# Patient Record
Sex: Female | Born: 1980 | Race: White | Hispanic: No | Marital: Single | Smoking: Never smoker
Health system: Southern US, Community
[De-identification: ages and names within clinical notes are randomized; demographics above are authoritative.]

## PROBLEM LIST (undated history)

## (undated) DIAGNOSIS — L309 Dermatitis, unspecified: Secondary | ICD-10-CM

## (undated) HISTORY — DX: Dermatitis, unspecified: L30.9

---

## 2010-11-26 ENCOUNTER — Emergency Department (HOSPITAL_COMMUNITY)
Admission: EM | Admit: 2010-11-26 | Discharge: 2010-11-26 | Payer: Self-pay | Source: Home / Self Care | Admitting: Emergency Medicine

## 2011-02-05 LAB — APTT: aPTT: 27 seconds (ref 24–37)

## 2011-02-05 LAB — CBC
HCT: 46.1 % — ABNORMAL HIGH (ref 36.0–46.0)
RBC: 5.32 MIL/uL — ABNORMAL HIGH (ref 3.87–5.11)
RDW: 12.4 % (ref 11.5–15.5)
WBC: 10.4 10*3/uL (ref 4.0–10.5)

## 2011-02-05 LAB — BASIC METABOLIC PANEL
BUN: 6 mg/dL (ref 6–23)
GFR calc Af Amer: >60 mL/min (ref >60)
GFR calc non Af Amer: >60 mL/min (ref >60)
Potassium: 3.4 mEq/L — ABNORMAL LOW (ref 3.5–5.1)
Sodium: 137 mEq/L (ref 135–145)

## 2011-02-05 LAB — PROTIME-INR: INR: 0.92 (ref 0.00–1.49)

## 2011-02-05 LAB — DIFFERENTIAL
Basophils Absolute: 0 10*3/uL (ref 0.0–0.1)
Lymphocytes Relative: 12 % (ref 12–46)
Lymphs Abs: 1.3 10*3/uL (ref 0.7–4.0)
Monocytes Absolute: 0.5 10*3/uL (ref 0.1–1.0)
Neutro Abs: 8.6 10*3/uL — ABNORMAL HIGH (ref 1.7–7.7)

## 2011-02-05 LAB — D-DIMER, QUANTITATIVE: D-Dimer, Quant: <0.22 ug/mL-FEU (ref 0.00–0.48)

## 2013-07-10 ENCOUNTER — Other Ambulatory Visit: Payer: Self-pay | Admitting: Chiropractic Medicine

## 2013-07-10 ENCOUNTER — Ambulatory Visit
Admission: RE | Admit: 2013-07-10 | Discharge: 2013-07-10 | Disposition: A | Discharge status: Home / Self Care | Payer: BC Managed Care – PPO | Source: Ambulatory Visit | Attending: Chiropractic Medicine | Admitting: Chiropractic Medicine

## 2013-07-10 DIAGNOSIS — M545 Low back pain, unspecified: Secondary | ICD-10-CM

## 2014-11-13 IMAGING — CR DG LUMBAR SPINE COMPLETE W/ BEND
7 series · 7 of 7 positions shown · non-contrast
Comparison: 11/26/2010 chest x-ray.

CLINICAL DATA: Low back pain for two through weeks.  No trauma.

LUMBAR SPINE - COMPLETE WITH BENDING VIEWS

[t l-spine a.p.]
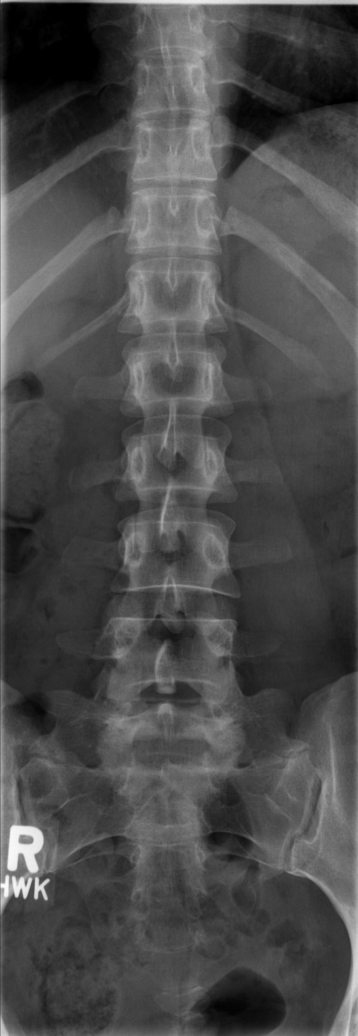

[t l-spine oblique exposure (1 of 2)]
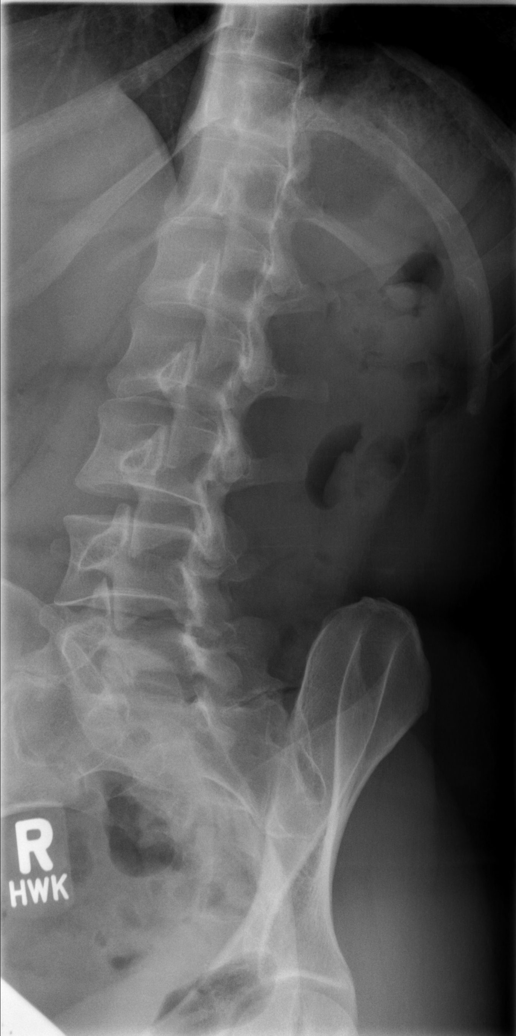

[t l-spine oblique exposure (2 of 2)]
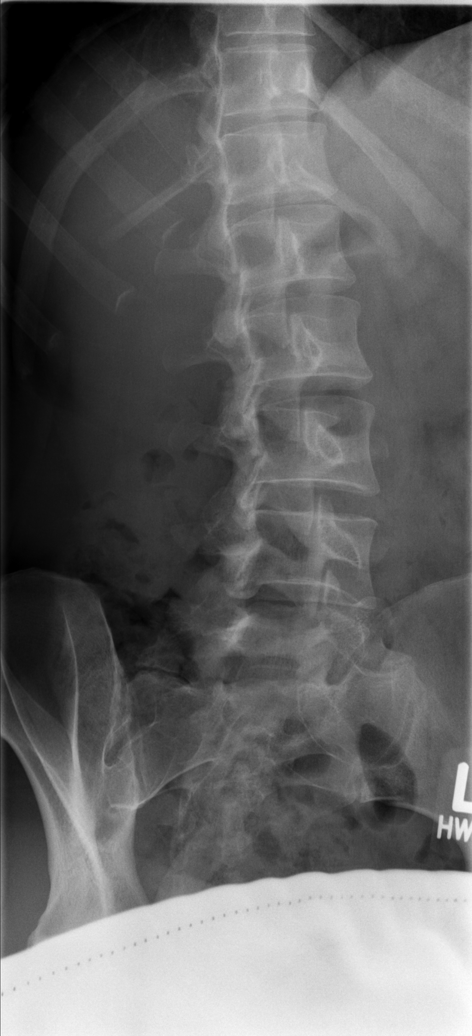

[t l-spine lat]
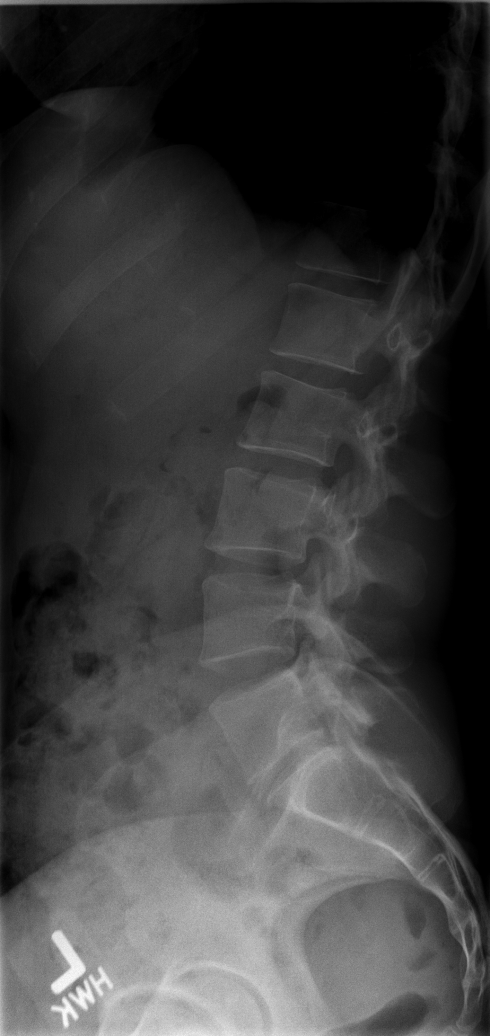

[t l-spine l5-s1 spot *]
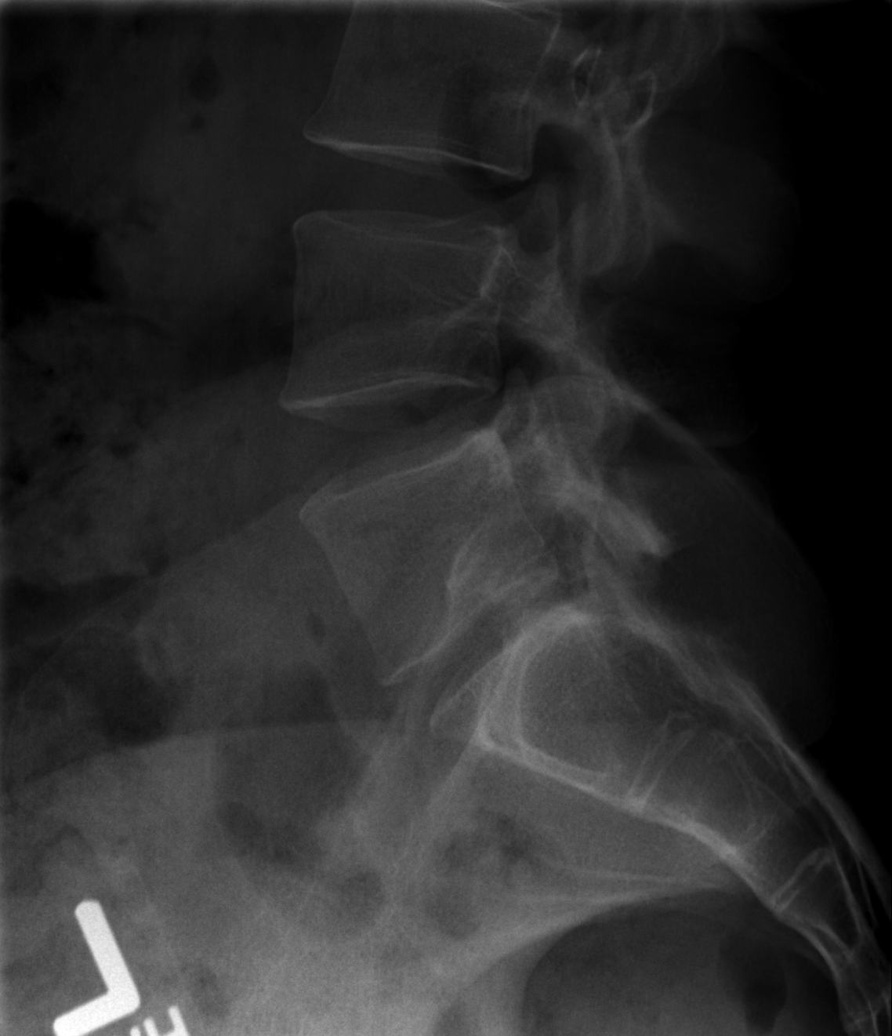

[w l-spine flexion/extension * (1 of 2)]
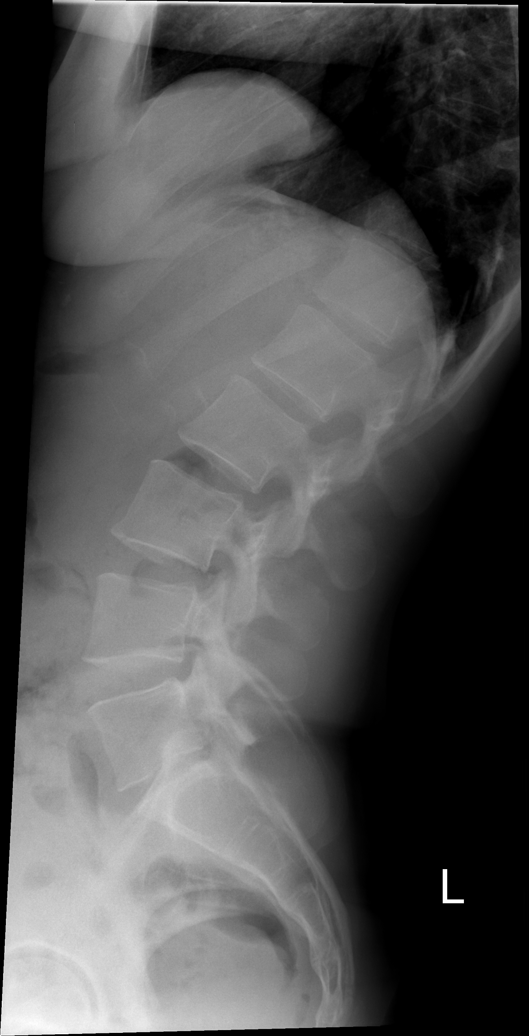

[w l-spine flexion/extension * (2 of 2)]
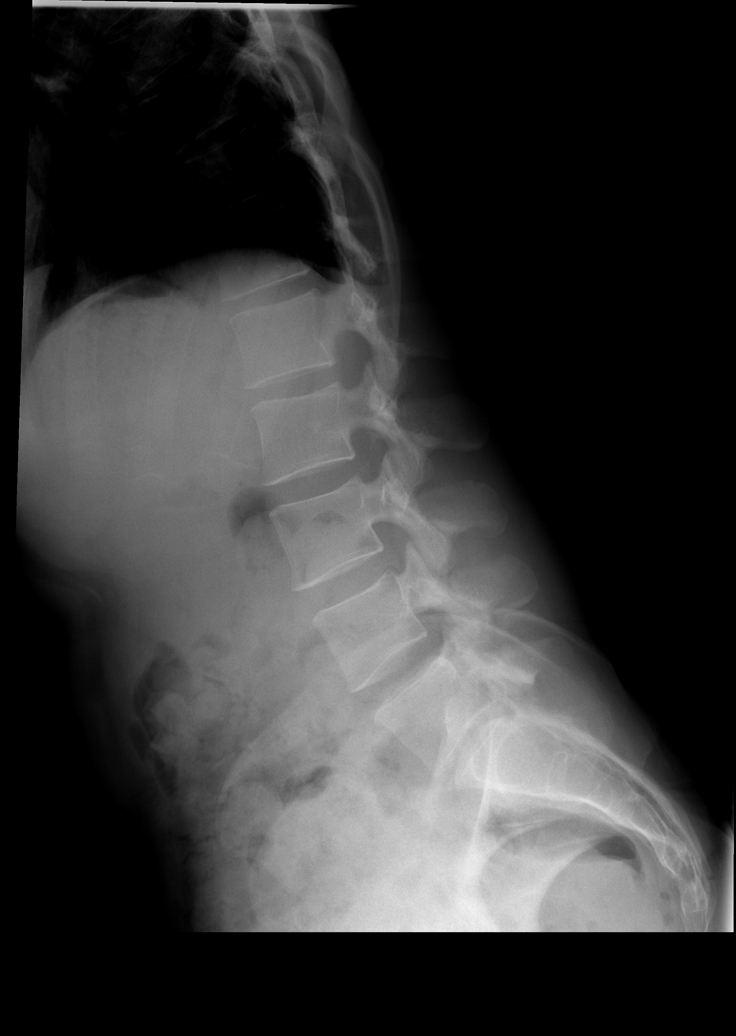

[7 of 7 positions shown; findings below may reference images not displayed]

FINDINGS: Utilizing prior chest x-ray, T12 ribs are noted to be
small.  There are four non-rib-bearing lumbar-type vertebra and a
transitional L5 vertebral body with a rudimentary disc at the L5-S1
level.

No lumbar disc space narrowing otherwise noted.

No abnormal motion between flexion and extension.

No pars defect detected.
IMPRESSION: Utilizing prior chest x-ray, T12 ribs are noted to be small.  There
are four non-rib-bearing lumbar-type vertebra and a transitional L5
vertebral body with a rudimentary disc at the L5-S1 level.

No lumbar disc space narrowing otherwise noted.

No abnormal motion between flexion and extension.

## 2019-07-16 ENCOUNTER — Ambulatory Visit (INDEPENDENT_AMBULATORY_CARE_PROVIDER_SITE_OTHER): Payer: BC Managed Care – PPO | Admitting: Allergy & Immunology

## 2019-07-16 ENCOUNTER — Other Ambulatory Visit: Payer: Self-pay

## 2019-07-16 ENCOUNTER — Encounter: Payer: Self-pay | Admitting: Allergy & Immunology

## 2019-07-16 VITALS — BP 128/90 | HR 100 | Temp 98.1°F | Resp 20 | Ht 61.5 in | Wt 115.6 lb

## 2019-07-16 DIAGNOSIS — J302 Other seasonal allergic rhinitis: Secondary | ICD-10-CM | POA: Diagnosis not present

## 2019-07-16 DIAGNOSIS — J3089 Other allergic rhinitis: Secondary | ICD-10-CM | POA: Diagnosis not present

## 2019-07-16 MED ORDER — MONTELUKAST SODIUM 10 MG PO TABS: 10.0000 mg | ORAL_TABLET | Freq: Every day | ORAL | 5 refills | Status: AC

## 2019-07-16 MED ORDER — EPINEPHRINE 0.3 MG/0.3ML IJ SOAJ: 0.3000 mg | Freq: Once | INTRAMUSCULAR | 1 refills | Status: AC

## 2019-07-16 MED ORDER — LEVOCETIRIZINE DIHYDROCHLORIDE 5 MG PO TABS: 5.0000 mg | ORAL_TABLET | Freq: Every evening | ORAL | 3 refills | Status: AC

## 2019-07-16 NOTE — Progress Notes (Signed)
NEW PATIENT  Date of Service/Encounter:  07/16/19  Referring provider: Patient, No Pcp Per   Assessment:   Seasonal and perennial allergic rhinitis (mouse, tobacco, grasses, ragweed, weeds, trees, indoor molds, outdoor molds, dust mites, cat, dog and cockroach)  Plan/Recommendations:   1. Seasonal and perennial allergic rhinitis - Testing today showed: mouse, tobacco, grasses, ragweed, weeds, trees, indoor molds, outdoor molds, dust mites, cat, dog and cockroach - Copy of test results provided.  - Avoidance measures provided. - Stop taking: all of your current atihistamines - Start taking: Xyzal (levocetirizine) 5mg  tablet once daily and Singulair (montelukast) 10mg  daily  - Singulair can cause increases in depressionn and nightmares, so stop it and give us a call if this occurs.  - You can use an extra dose of the antihistamine, if needed, for breakthrough symptoms.  - Consider nasal saline rinses 1-2 times daily to remove allergens from the nasal cavities as well as help with mucous clearance (this is especially helpful to do before the nasal sprays are given) - Consider allergy shots as a means of long-term control. - Allergy shots "re-train" and "reset" the immune system to ignore environmental allergens and decrease the resulting immune response to those allergens (sneezing, itchy watery eyes, runny nose, nasal congestion, etc).    - Allergy shots improve symptoms in 75-85% of patients.  - Call your insurance company to check on co-payments and give us a call when you make a decision.  2. Return in about 3 months (around 10/16/2019). This can be an in-person, a virtual Webex or a telephone follow up visit.  Subjective:   Nichole Stanton is a 38 y.o. female presenting today for evaluation of  Chief Complaint  Patient presents with  . Allergy Testing  . Immunotherapy    Nichole DingwallJennifer Stanton has a history of the following: Patient Active Problem List   Diagnosis Date Noted  .  Seasonal and perennial allergic rhinitis 07/16/2019    History obtained from: chart review and patient.  Nichole DingwallJennifer Stanton was referred by Patient, No Pcp Per.     Nichole Stanton is a 38 y.o. female presenting for skin testing.   Patient reports moving to [place] 13 years ago from [place]. Before moving she had been on allergy shots for 4 years and allergies were well controlled. Over the past few years she has noticed a progression in her allergic symptoms. She takes allegra during the spring, but otherwise not on any allergy medications. As a child she had Eczema , but it stopped when she was a teen. Recently had a few flairs of Ezema she notices in spots where she was carrying her puppy. Her puppy is a [employer] [place] Terrier. No history of Asthma, urticaria, food allergies, or anaphylaxis.   She is from [place]. [place]. Symptoms have been progressing over the course of the last 13 years since moving here. She works for [employer], especially with regards to life insurance.   Otherwise, there is no history of other atopic diseases, including asthma, food allergies, drug allergies, stinging insect allergies, urticaria or contact dermatitis. There is no significant infectious history.    Past Medical History: Patient Active Problem List   Diagnosis Date Noted  . Seasonal and perennial allergic rhinitis 07/16/2019    Medication List:  Allergies as of 07/16/2019   Not on File     Medication List       Accurate as of July 16, 2019 12:15 PM. If  you have any questions, ask your nurse or doctor.        fexofenadine 180 MG tablet Commonly known as: ALLEGRA Take 180 mg by mouth daily.        Past Surgical History: History reviewed. No pertinent surgical history.   Family History: Family History  Problem Relation Age of Onset  . Eczema Mother      Social History: Nichole Stanton lives at home with her dog.  She lives in a house that  is 67 years old.  There is wood throughout the home.  They have electric heating and central cooling.  There is one Westie in the home. There is no cigarette smoking exposing. There are no dust mite coverings on the bedding. She is working as a Arts administrator.    12 point review of systems otherwise negative unless mentioned in HPI    Objective:   Blood pressure 128/90, pulse 100, temperature 98.1 F (36.7 C), temperature source Temporal, resp. rate 20, height 5' 1.5" (1.562 m), weight 115 lb 9.6 oz (52.4 kg), SpO2 97 %. Body mass index is 21.49 kg/m.   Physical Exam:   Physical Exam  Constitutional: She appears well-developed and well-nourished.  Pleasant female.  HENT:  Head: Normocephalic and atraumatic.  Right Ear: Tympanic membrane, external ear and ear canal normal. No drainage, swelling or tenderness. Tympanic membrane is not injected, not scarred, not erythematous, not retracted and not bulging.  Left Ear: Tympanic membrane, external ear and ear canal normal. No drainage, swelling or tenderness. Tympanic membrane is not injected, not scarred, not erythematous, not retracted and not bulging.  Nose: Mucosal edema and rhinorrhea present. No nasal deformity or septal deviation. No epistaxis. Right sinus exhibits no maxillary sinus tenderness and no frontal sinus tenderness. Left sinus exhibits no maxillary sinus tenderness and no frontal sinus tenderness.  Mouth/Throat: Uvula is midline and oropharynx is clear and moist. Mucous membranes are not pale and not dry.  Cobblestoning present in the posterior oropharynx.   Eyes: Pupils are equal, round, and reactive to light. Conjunctivae and EOM are normal. Right eye exhibits no chemosis and no discharge. Left eye exhibits no chemosis and no discharge. Right conjunctiva is not injected. Left conjunctiva is not injected. No scleral icterus.  Cardiovascular: Normal rate, regular rhythm and normal heart sounds.  Respiratory: Effort normal  and breath sounds normal. No accessory muscle usage. No tachypnea. No respiratory distress. She has no wheezes. She has no rhonchi. She has no rales. She exhibits no tenderness.  No wheezing or crackles noted. No increased work of breathing.   GI: There is no abdominal tenderness. There is no rebound and no guarding.  Lymphadenopathy:       Head (right side): No submandibular, no tonsillar and no occipital adenopathy present.       Head (left side): No submandibular, no tonsillar and no occipital adenopathy present.    She has no cervical adenopathy.  Neurological: She is alert.  Skin: No abrasion, no petechiae and no rash noted. Rash is not papular, not vesicular and not urticarial. No erythema. No pallor.  No urticarial or eczematous lesions noted.   Psychiatric: She has a normal mood and affect.     Allergy Studies:    Airborne Adult Perc - 07/16/19 1102    Time Antigen Placed  1103    Allergen Manufacturer  Lavella Hammock    Location  Back    Number of Test  59    Panel 1  Select  1. Control-Buffer 50% Glycerol  Negative    2. Control-Histamine 1 mg/ml  2+    3. Albumin saline  Negative    4. Bahia  Negative    5. [place] Territ[place]  Negative    6. Johnson  Negative    7. Kentucky Blue  2+    8. Meadow Fescue  2+    9. Perennial Rye  Negative    10. Sweet Vernal  2+    11. Timothy  2+    12. Cocklebur  2+    13. Burweed Marshelder  Negative    14. Ragweed, short  3+    15. Ragweed, Giant  Negative    16. Plantain,  English  3+    17. Lamb's Quarters  2+    18. Sheep Sorrell  2+    19. Rough Pigweed  2+    20. Marsh Elder, Rough  2+    21. Mugwort, Common  2+    22. Ash mix  2+    23. Birch mix  3+    24. Beech American  3+    25. Box, Elder  Negative    26. Cedar, red  Negative    27. Cottonwood, Guinea-Biss[place]astern  Negative    28. Elm mix  2+    29. Hickory mix  3+    30. Maple mix  Negative    31. Oak, Guinea-Biss[place]astern mix  3+    32. Pecan Pollen  Negative    33. Pine mix  Negative    34.  Sycamore Eastern  Negative    35. Walnut, Black Pollen  3+    36. Alternaria alternata  2+    37. Cladosporium Herbarum  3+    38. Aspergillus mix  Negative    39. Penicillium mix  Negative    40. Bipolaris sorokiniana (Helminthosporium)  Negative    41. Drechslera spicifera (Curvularia)  2+    42. Mucor plumbeus  2+    43. Fusarium moniliforme  2+    44. Aureobasidium pullulans (pullulara)  2+    45. Rhizopus oryzae  2+    46. Botrytis cinera  Negative    47. Epicoccum nigrum  Negative    48. Phoma betae  Negative    49. Candida Albicans  3+    50. Trichophyton mentagrophytes  Negative    51. Mite, D Farinae  5,000 AU/ml  3+    52. Mite, D Pteronyssinus  5,000 AU/ml  3+    53. Cat Hair 10,000 BAU/ml  3+    54.  Dog Epithelia  Negative    55. Mixed Feathers  Negative    56. Horse Epithelia  2+    57. Cockroach, German  Negative    58. Mouse  2+    59. Tobacco Leaf  2+     Intradermal - 07/16/19 1102    Time Antigen Placed  1102    Allergen Manufacturer  Greer    Location  Arm    Number of Test  5    Intradermal  Select    Control  Negative    [place]  3+    Johnson  3+    Mold 1  Negative    Dog  2+       Allergy testing results were read and interpreted by myself, documented by clinical staff.     Thurmon FairJeff Steen, MD PGY1  [phone]    I performed a history and physical examination of the patient  and discussed her management with the resident. I reviewed the resident's note and agree with the documented findings and plan of care. The note in its entirety was edited by myself, including the physical exam, assessment, and plan.   Malachi BondsJoel Lott Seelbach, MD [facility]

## 2019-07-16 NOTE — Patient Instructions (Addendum)
1. Seasonal and perennial allergic rhinitis - Testing today showed: mouse, tobacco, grasses, ragweed, weeds, trees, indoor molds, outdoor molds, dust mites, cat, dog and cockroach - Copy of test results provided.  - Avoidance measures provided. - Stop taking: all of your current antihistamines - Start taking: Xyzal (levocetirizine) 5mg  tablet once daily and Singulair (montelukast) 10mg  daily  - Singulair can cause increases in depression and nightmares, so stop it and give us a call if this occurs.  - You can use an extra dose of the antihistamine, if needed, for breakthrough symptoms.  - Consider nasal saline rinses 1-2 times daily to remove allergens from the nasal cavities as well as help with mucous clearance (this is especially helpful to do before the nasal sprays are given) - Consider allergy shots as a means of long-term control. - Allergy shots "re-train" and "reset" the immune system to ignore environmental allergens and decrease the resulting immune response to those allergens (sneezing, itchy watery eyes, runny nose, nasal congestion, etc).    - Allergy shots improve symptoms in 75-85% of patients.  - Call your insurance company to check on co-payments and give us a call when you make a decision.  2. Return in about 3 months (around 10/16/2019). This can be an in-person, a virtual Webex or a telephone follow up visit.   Please inform us of any Emergency Department visits, hospitalizations, or changes in symptoms. Call us before going to the ED for breathing or allergy symptoms since we might be able to fit you in for a sick visit. Feel free to contact us anytime with any questions, problems, or concerns.  It was a pleasure to meet you today!  Websites that have reliable patient information: 1. American Academy of Asthma, Allergy, and Immunology: www.aaaai.org 2. Food Allergy Research and Education (FARE): foodallergy.org 3. Mothers of Asthmatics:  http://www.asthmacommunitynetwork.org 4. American College of Allergy, Asthma, and Immunology: www.acaai.org  Like us on [employer] and Instagram for our latest updates!      Make sure you are registered to vote! If you have moved or changed any of your contact information, you will need to get this updated before voting!  In some cases, you MAY be able to register to vote online: [email or web address]    Voter ID laws are NOT going into effect for the General Election in November 2020! DO NOT let this stop you from exercising your right to vote!   Absentee voting is the SAFEST way to vote during the coronavirus pandemic!   Download and print an absentee ballot request form at [email or web address] or you can scan the QR code below with your smart phone:      More information on absentee ballots can be found here: [email or web address]     Reducing Pollen Exposure  The American Academy of Allergy, Asthma and Immunology suggests the following steps to reduce your exposure to pollen during allergy seasons.    1. Do not hang sheets or clothing out to dry; pollen may collect on these items. 2. Do not mow lawns or spend time around freshly cut grass; mowing stirs up pollen. 3. Keep windows closed at night.  Keep car windows closed while driving. 4. Minimize morning activities outdoors, a time when pollen counts are usually at their highest. 5. Stay indoors as much as possible when pollen counts or humidity is high and on windy days when pollen tends to remain in the air longer. 6. Use air conditioning when possible.  Many air conditioners have filters that trap  the pollen spores. 7. Use a HEPA room air filter to remove pollen form the indoor air you breathe.  Control of Mold Allergen   Mold and fungi can grow on a variety of surfaces provided certain temperature and moisture conditions exist.  Outdoor molds grow on plants, decaying  vegetation and soil.  The major outdoor mold, Alternaria and Cladosporium, are found in very high numbers during hot and dry conditions.  Generally, a late Summer - Fall peak is seen for common outdoor fungal spores.  Rain will temporarily lower outdoor mold spore count, but counts rise rapidly when the rainy period ends.  The most important indoor molds are Aspergillus and Penicillium.  Dark, humid and poorly ventilated basements are ideal sites for mold growth.  The next most common sites of mold growth are the bathroom and the kitchen.  Outdoor (Seasonal) Mold Control  1. Use air conditioning and keep windows closed 2. Avoid exposure to decaying vegetation. 3. Avoid leaf raking. 4. Avoid grain handling. 5. Consider wearing a face mask if working in moldy areas.   Indoor (Perennial) Mold Control   1. Maintain humidity below 50%. 2. Clean washable surfaces with 5% bleach solution. 3. Remove sources e.g. contaminated carpets.     Control of Dog or Cat Allergen  Avoidance is the best way to manage a dog or cat allergy. If you have a dog or cat and are allergic to dog or cats, consider removing the dog or cat from the home. If you have a dog or cat but dont want to find it a new home, or if your family wants a pet even though someone in the household is allergic, here are some strategies that may help keep symptoms at bay:  1. Keep the pet out of your bedroom and restrict it to only a few rooms. Be advised that keeping the dog or cat in only one room will not limit the allergens to that room. 2. Dont pet, hug or kiss the dog or cat; if you do, wash your hands with soap and water. 3. High-efficiency particulate air (HEPA) cleaners run continuously in a bedroom or living room can reduce allergen levels over time. 4. Regular use of a high-efficiency vacuum cleaner or a central vacuum can reduce allergen levels. 5. Giving your dog or cat a bath at least once a week can reduce airborne  allergen.  Control of House Dust Mite Allergen    House dust mites play a major role in allergic asthma and rhinitis.  They occur in environments with high humidity wherever human skin, the food for dust mites is found. High levels have been detected in dust obtained from mattresses, pillows, carpets, upholstered furniture, bed covers, clothes and soft toys.  The principal allergen of the house dust mite is found in its feces.  A gram of dust may contain 1,000 mites and 250,000 fecal particles.  Mite antigen is easily measured in the air during house cleaning activities.    1. Encase mattresses, including the box spring, and pillow, in an air tight cover.  Seal the zipper end of the encased mattresses with wide adhesive tape. 2. Wash the bedding in water of 130 degrees Farenheit weekly.  Avoid cotton comforters/quilts and flannel bedding: the most ideal bed covering is the dacron comforter. 3. Remove all upholstered furniture from the bedroom. 4. Remove carpets, carpet padding, rugs, and non-washable window drapes from the bedroom.  Wash drapes weekly or use plastic window coverings. 5. Remove all non-washable  stuffed toys from the bedroom.  Wash stuffed toys weekly. 6. Have the room cleaned frequently with a vacuum cleaner and a damp dust-mop.  The patient should not be in a room which is being cleaned and should wait 1 hour after cleaning before going into the room. 7. Close and seal all heating outlets in the bedroom.  Otherwise, the room will become filled with dust-laden air.  An electric heater can be used to heat the room. 8. Reduce indoor humidity to less than 50%.  Do not use a humidifier.  Control of Cockroach Allergen  Cockroach allergen has been identified as an important cause of acute attacks of asthma, especially in urban settings.  There are fifty-five species of cockroach that exist in the [place], however only three, the [place], [place] and Oriental species produce  allergen that can affect patients with Asthma.  Allergens can be obtained from fecal particles, egg casings and secretions from cockroaches.    1. Remove food sources. 2. Reduce access to water. 3. Seal access and entry points. 4. Spray runways with 0.5-1% Diazinon or Chlorpyrifos 5. Blow boric acid power under stoves and refrigerator. 6. Place bait stations (hydramethylnon) at feeding sites.  Allergy Shots   Allergies are the result of a chain reaction that starts in the immune system. Your immune system controls how your body defends itself. For instance, if you have an allergy to pollen, your immune system identifies pollen as an invader or allergen. Your immune system overreacts by producing antibodies called Immunoglobulin E (IgE). These antibodies travel to cells that release chemicals, causing an allergic reaction.  The concept behind allergy immunotherapy, whether it is received in the form of shots or tablets, is that the immune system can be desensitized to specific allergens that trigger allergy symptoms. Although it requires time and patience, the payback can be long-term relief.  How Do Allergy Shots Work?  Allergy shots work much like a vaccine. Your body responds to injected amounts of a particular allergen given in increasing doses, eventually developing a resistance and tolerance to it. Allergy shots can lead to decreased, minimal or no allergy symptoms.  There generally are two phases: build-up and maintenance. Build-up often ranges from three to six months and involves receiving injections with increasing amounts of the allergens. The shots are typically given once or twice a week, though more rapid build-up schedules are sometimes used.  The maintenance phase begins when the most effective dose is reached. This dose is different for each person, depending on how allergic you are and your response to the build-up injections. Once the maintenance dose is reached, there are  longer periods between injections, typically two to four weeks.  Occasionally doctors give cortisone-type shots that can temporarily reduce allergy symptoms. These types of shots are different and should not be confused with allergy immunotherapy shots.  Who Can Be Treated with Allergy Shots?  Allergy shots may be a good treatment approach for people with allergic rhinitis (hay fever), allergic asthma, conjunctivitis (eye allergy) or stinging insect allergy.   Before deciding to begin allergy shots, you should consider:   The length of allergy season and the severity of your symptoms  Whether medications and/or changes to your environment can control your symptoms  Your desire to avoid long-term medication use  Time: allergy immunotherapy requires a major time commitment  Cost: may vary depending on your insurance coverage  Allergy shots for children age 46five and older are effective and often well tolerated. They  might prevent the onset of new allergen sensitivities or the progression to asthma.  Allergy shots are not started on patients who are pregnant but can be continued on patients who become pregnant while receiving them. In some patients with other medical conditions or who take certain common medications, allergy shots may be of risk. It is important to mention other medications you talk to your allergist.   When Will I Feel Better?  Some may experience decreased allergy symptoms during the build-up phase. For others, it may take as long as 12 months on the maintenance dose. If there is no improvement after a year of maintenance, your allergist will discuss other treatment options with you.  If you arent responding to allergy shots, it may be because there is not enough dose of the allergen in your vaccine or there are missing allergens that were not identified during your allergy testing. Other reasons could be that there are high levels of the allergen in your environment or  major exposure to non-allergic triggers like tobacco smoke.  What Is the Length of Treatment?  Once the maintenance dose is reached, allergy shots are generally continued for three to five years. The decision to stop should be discussed with your allergist at that time. Some people may experience a permanent reduction of allergy symptoms. Others may relapse and a longer course of allergy shots can be considered.  What Are the Possible Reactions?  The two types of adverse reactions that can occur with allergy shots are local and systemic. Common local reactions include very mild redness and swelling at the injection site, which can happen immediately or several hours after. A systemic reaction, which is less common, affects the entire body or a particular body system. They are usually mild and typically respond quickly to medications. Signs include increased allergy symptoms such as sneezing, a stuffy nose or hives.  Rarely, a serious systemic reaction called anaphylaxis can develop. Symptoms include swelling in the throat, wheezing, a feeling of tightness in the chest, nausea or dizziness. Most serious systemic reactions develop within 30 minutes of allergy shots. This is why it is strongly recommended you wait in your doctors office for 30 minutes after your injections. Your allergist is trained to watch for reactions, and his or her staff is trained and equipped with the proper medications to identify and treat them.  Who Should Administer Allergy Shots?  The preferred location for receiving shots is your prescribing allergists office. Injections can sometimes be given at another facility where the physician and staff are trained to recognize and treat reactions, and have received instructions by your prescribing allergist.

## 2019-10-27 ENCOUNTER — Ambulatory Visit: Payer: BC Managed Care – PPO | Admitting: Allergy & Immunology

## 2019-12-10 ENCOUNTER — Encounter: Payer: Self-pay | Admitting: Allergy & Immunology

## 2019-12-10 ENCOUNTER — Ambulatory Visit: Payer: 59 | Admitting: Allergy & Immunology

## 2019-12-10 ENCOUNTER — Other Ambulatory Visit: Payer: Self-pay

## 2019-12-10 VITALS — BP 104/66 | HR 86 | Temp 98.4°F | Resp 18 | Ht 61.5 in

## 2019-12-10 DIAGNOSIS — J302 Other seasonal allergic rhinitis: Secondary | ICD-10-CM | POA: Diagnosis not present

## 2019-12-10 DIAGNOSIS — J3089 Other allergic rhinitis: Secondary | ICD-10-CM | POA: Diagnosis not present

## 2019-12-10 MED ORDER — TRIAMCINOLONE ACETONIDE 0.1 % EX OINT: 1.0000 "application " | TOPICAL_OINTMENT | Freq: Two times a day (BID) | CUTANEOUS | 5 refills | Status: AC

## 2019-12-10 MED ORDER — EPINEPHRINE 0.3 MG/0.3ML IJ SOAJ: 0.3000 mg | Freq: Once | INTRAMUSCULAR | 1 refills | Status: AC

## 2019-12-10 NOTE — Patient Instructions (Addendum)
1. Seasonal and perennial allergic rhinitis (mouse, tobacco, grasses, ragweed, weeds, trees, indoor molds, outdoor molds, dust mites, cat, dog and cockroach) - Information for allergen immunotherapy provided. - Consent signed.  Audry Riles training provided.  - Continue taking: Xyzal (levocetirizine) 5mg  tablet 1-2 times daily - Make an appointment in two weeks for your first injection.   2. Return in about 6 months (around 06/08/2020). This can be an in-person, a virtual Webex or a telephone follow up visit.   Please inform 06/10/2020 of any Emergency Department visits, hospitalizations, or changes in symptoms. Call us before going to the ED for breathing or allergy symptoms since we might be able to fit you in for a sick visit. Feel free to contact us anytime with any questions, problems, or concerns.  It was a pleasure to see you again today! Zeus is so freaking adorable!!   Websites that have reliable patient information: 1. American Academy of Asthma, Allergy, and Immunology: www.aaaai.org 2. Food Allergy Research and Education (FARE): foodallergy.org 3. Mothers of Asthmatics: http://www.asthmacommunitynetwork.org 4. American College of Allergy, Asthma, and Immunology: www.acaai.org   COVID-19 Vaccine Information can be found at: [place] For questions related to vaccine distribution or appointments, please email [email or web address] or call [phone].     "Like" [phone] on Facebook and Instagram for our latest updates!        Make sure you are registered to vote! If you have moved or changed any of your contact information, you will need to get this updated before voting!  In some cases, you MAY be able to register to vote online: [place]

## 2019-12-10 NOTE — Progress Notes (Signed)
FOLLOW UP  Date of Service/Encounter:  12/10/19   Assessment:   Seasonal and perennial allergic rhinitis (mouse, tobacco, grasses, ragweed, weeds, trees, indoor molds, outdoor molds, dust mites, cat, dog and cockroach)  Plan/Recommendations:   1. Seasonal and perennial allergic rhinitis (mouse, tobacco, grasses, ragweed, weeds, trees, indoor molds, outdoor molds, dust mites, cat, dog and cockroach) - Information for allergen immunotherapy provided. - Consent signed.  Nichole Stanton training provided.  - Continue taking: Xyzal (levocetirizine) 5mg  tablet 1-2 times daily - Make an appointment in two weeks for your first injection.  - Prescription written with dust mite and dog dosing pushed.   2. Return in about 6 months (around 06/08/2020). This can be an in-person, a virtual Webex or a telephone follow up visit.  Subjective:   Nichole Stanton is a 39 y.o. female presenting today for follow up of  Chief Complaint  Patient presents with  . Allergic Rhinitis     Nichole Stanton has a history of the following: Patient Active Problem List   Diagnosis Date Noted  . Seasonal and perennial allergic rhinitis 07/16/2019    History obtained from: chart review and patient.  Nichole Stanton is a 39 y.o. female presenting for a follow up visit.  She was last seen in August 2020 as a new patient.  At that time, she was evaluated for worsening allergic rhinitis symptoms.  She had testing that was positive to mouse, tobacco, grasses, ragweed, weeds, trees, indoor and outdoor molds, dust mite, cat, dog, and cockroach.  We started her on Xyzal and Singulair.  We did not do any nasal sprays since she was not a fan of these.   Since last visit, she has done very well.  She does feel that the addition of the Xyzal has made her symptoms more tolerable.  She never did add the Singulair because she was worried about the psychiatric side effects.  She ended up taking a 6-week trip to [place] to work remotely at her  parents house.  She does want to start allergen immunotherapy, but she decided to come back now to discuss it since she was in [place] for so long.  She has not needed any prednisone or antibiotics since last visit.  She does have a lot of questions about allergen immunotherapy today.  She does feel that her symptoms of gotten worse since adopting her dog - Nichole Stanton - around 8 months ago.   Otherwise, there have been no changes to her past medical history, surgical history, family history, or social history.    Review of Systems  Constitutional: Negative.  Negative for chills, fever, malaise/fatigue and weight loss.  HENT: Positive for congestion. Negative for ear discharge, ear pain, nosebleeds and sinus pain.   Eyes: Negative for pain, discharge and redness.  Respiratory: Negative for cough, sputum production, shortness of breath and wheezing.   Cardiovascular: Negative.  Negative for chest pain and palpitations.  Gastrointestinal: Negative for abdominal pain, heartburn, nausea and vomiting.  Skin: Negative.  Negative for itching and rash.  Neurological: Negative for dizziness and headaches.  Endo/Heme/Allergies: Positive for environmental allergies. Does not bruise/bleed easily.       Objective:   Blood pressure 104/66, pulse 86, temperature 98.4 F (36.9 C), temperature source Temporal, resp. rate 18, height 5' 1.5" (1.562 m), SpO2 94 %. Body mass index is 21.49 kg/m.   Physical Exam:  Physical Exam  Constitutional: She appears well-developed.  Pleasant female. Very talkative.   HENT:  Head: Normocephalic and atraumatic.  Right Ear: Tympanic membrane, external ear and ear canal normal.  Left Ear: Tympanic membrane, external ear and ear canal normal.  Nose: Mucosal edema present. No rhinorrhea, nasal deformity or septal deviation. No epistaxis. Right sinus exhibits no maxillary sinus tenderness and no frontal sinus tenderness. Left sinus exhibits no maxillary sinus tenderness  and no frontal sinus tenderness.  Mouth/Throat: Uvula is midline and oropharynx is clear and moist. Mucous membranes are not pale and not dry.  Tonsils 2+ bilaterally without exudates. There are enlarged turbinates present.   Eyes: Pupils are equal, round, and reactive to light. Conjunctivae and EOM are normal. Right eye exhibits no chemosis and no discharge. Left eye exhibits no chemosis and no discharge. Right conjunctiva is not injected. Left conjunctiva is not injected.  Cardiovascular: Normal rate, regular rhythm and normal heart sounds.  Respiratory: Effort normal and breath sounds normal. No accessory muscle usage. No tachypnea. No respiratory distress. She has no wheezes. She has no rhonchi. She has no rales. She exhibits no tenderness.  Moving air well in all lung fields.   Lymphadenopathy:    She has no cervical adenopathy.  Neurological: She is alert.  Skin: No abrasion, no petechiae and no rash noted. Rash is not papular, not vesicular and not urticarial. No erythema. No pallor.  Psychiatric: She has a normal mood and affect.     Diagnostic studies: none    Malachi Bonds, MD  [facility]

## 2019-12-14 NOTE — Progress Notes (Signed)
EXP 12/15/20 

## 2019-12-16 DIAGNOSIS — J3089 Other allergic rhinitis: Secondary | ICD-10-CM | POA: Diagnosis not present

## 2019-12-17 DIAGNOSIS — J3081 Allergic rhinitis due to animal (cat) (dog) hair and dander: Secondary | ICD-10-CM

## 2019-12-31 ENCOUNTER — Other Ambulatory Visit: Payer: Self-pay

## 2019-12-31 ENCOUNTER — Ambulatory Visit (INDEPENDENT_AMBULATORY_CARE_PROVIDER_SITE_OTHER): Payer: 59

## 2019-12-31 DIAGNOSIS — J302 Other seasonal allergic rhinitis: Secondary | ICD-10-CM

## 2019-12-31 DIAGNOSIS — J3089 Other allergic rhinitis: Secondary | ICD-10-CM | POA: Diagnosis not present

## 2019-12-31 NOTE — Progress Notes (Signed)
Immunotherapy   Patient Details  Name: Nichole Stanton MRN: [id number] Date of Birth: May 04, 1981  12/31/2019  [name] started injections for  [id number]-[id number]-DM & G-W-T-C-D Following schedule: B  Frequency:1 time per week Epi-Pen:Epi-Pen Available  Consent signed and patient instructions given. Patient waited 30 minutes in office post injection. No local or systemic symptoms upon discharge from the office.    Exie Parody 12/31/2019, 2:58 PM

## 2020-01-08 ENCOUNTER — Ambulatory Visit (INDEPENDENT_AMBULATORY_CARE_PROVIDER_SITE_OTHER): Payer: 59 | Admitting: *Deleted

## 2020-01-08 DIAGNOSIS — J309 Allergic rhinitis, unspecified: Secondary | ICD-10-CM

## 2020-01-15 ENCOUNTER — Ambulatory Visit (INDEPENDENT_AMBULATORY_CARE_PROVIDER_SITE_OTHER): Payer: 59 | Admitting: *Deleted

## 2020-01-15 DIAGNOSIS — J309 Allergic rhinitis, unspecified: Secondary | ICD-10-CM
